# Patient Record
Sex: Female | Born: 1960 | Race: Black or African American | Hispanic: No | Marital: Married | Smoking: Never smoker
Health system: Southern US, Community
[De-identification: ages and names within clinical notes are randomized; demographics above are authoritative.]

## PROBLEM LIST (undated history)

## (undated) DIAGNOSIS — K219 Gastro-esophageal reflux disease without esophagitis: Secondary | ICD-10-CM

## (undated) DIAGNOSIS — E669 Obesity, unspecified: Secondary | ICD-10-CM

## (undated) DIAGNOSIS — J309 Allergic rhinitis, unspecified: Secondary | ICD-10-CM

## (undated) DIAGNOSIS — I10 Essential (primary) hypertension: Secondary | ICD-10-CM

## (undated) HISTORY — DX: Essential (primary) hypertension: I10

## (undated) HISTORY — DX: Obesity, unspecified: E66.9

## (undated) HISTORY — DX: Gastro-esophageal reflux disease without esophagitis: K21.9

## (undated) HISTORY — PX: COLONOSCOPY: SHX174

## (undated) HISTORY — PX: VAGINAL HYSTERECTOMY: SUR661

## (undated) HISTORY — DX: Allergic rhinitis, unspecified: J30.9

---

## 2004-07-14 DIAGNOSIS — I10 Essential (primary) hypertension: Secondary | ICD-10-CM

## 2004-07-14 HISTORY — DX: Essential (primary) hypertension: I10

## 2005-09-17 ENCOUNTER — Ambulatory Visit: Payer: Self-pay | Admitting: Internal Medicine

## 2005-09-19 ENCOUNTER — Ambulatory Visit: Payer: Self-pay | Admitting: *Deleted

## 2005-10-21 ENCOUNTER — Ambulatory Visit: Payer: Self-pay | Admitting: Internal Medicine

## 2005-11-07 ENCOUNTER — Ambulatory Visit: Payer: Self-pay | Admitting: Internal Medicine

## 2005-11-10 ENCOUNTER — Ambulatory Visit: Payer: Self-pay | Admitting: Internal Medicine

## 2005-12-10 ENCOUNTER — Ambulatory Visit: Payer: Self-pay | Admitting: Internal Medicine

## 2006-06-09 ENCOUNTER — Ambulatory Visit: Payer: Self-pay | Admitting: Internal Medicine

## 2007-01-05 ENCOUNTER — Ambulatory Visit: Payer: Self-pay | Admitting: Internal Medicine

## 2007-01-07 ENCOUNTER — Ambulatory Visit (HOSPITAL_COMMUNITY): Admission: RE | Admit: 2007-01-07 | Discharge: 2007-01-07 | Payer: Self-pay | Admitting: Internal Medicine

## 2007-01-08 ENCOUNTER — Ambulatory Visit: Payer: Self-pay | Admitting: Internal Medicine

## 2007-01-11 ENCOUNTER — Ambulatory Visit: Payer: Self-pay | Admitting: Internal Medicine

## 2007-01-13 ENCOUNTER — Encounter: Admission: RE | Admit: 2007-01-13 | Discharge: 2007-01-13 | Payer: Self-pay | Admitting: Internal Medicine

## 2007-03-31 ENCOUNTER — Encounter (INDEPENDENT_AMBULATORY_CARE_PROVIDER_SITE_OTHER): Payer: Self-pay | Admitting: *Deleted

## 2007-10-01 ENCOUNTER — Ambulatory Visit: Payer: Self-pay | Admitting: Internal Medicine

## 2007-10-01 DIAGNOSIS — G5600 Carpal tunnel syndrome, unspecified upper limb: Secondary | ICD-10-CM

## 2007-10-01 DIAGNOSIS — J309 Allergic rhinitis, unspecified: Secondary | ICD-10-CM | POA: Insufficient documentation

## 2007-10-01 DIAGNOSIS — I10 Essential (primary) hypertension: Secondary | ICD-10-CM | POA: Insufficient documentation

## 2009-03-03 ENCOUNTER — Emergency Department (HOSPITAL_COMMUNITY): Admission: EM | Admit: 2009-03-03 | Discharge: 2009-03-03 | Payer: Self-pay | Admitting: Family Medicine

## 2014-11-08 ENCOUNTER — Encounter: Payer: Self-pay | Admitting: Family Medicine

## 2014-11-16 ENCOUNTER — Ambulatory Visit (INDEPENDENT_AMBULATORY_CARE_PROVIDER_SITE_OTHER): Payer: BLUE CROSS/BLUE SHIELD | Admitting: Medical

## 2014-11-16 ENCOUNTER — Encounter: Payer: Self-pay | Admitting: Medical

## 2014-11-16 ENCOUNTER — Telehealth: Payer: Self-pay | Admitting: Medical

## 2014-11-16 VITALS — BP 138/80 | HR 76 | Temp 98.1°F | Resp 15 | Ht 66.0 in | Wt 202.0 lb

## 2014-11-16 DIAGNOSIS — Z1239 Encounter for other screening for malignant neoplasm of breast: Secondary | ICD-10-CM | POA: Diagnosis not present

## 2014-11-16 DIAGNOSIS — I10 Essential (primary) hypertension: Secondary | ICD-10-CM

## 2014-11-16 DIAGNOSIS — Z23 Encounter for immunization: Secondary | ICD-10-CM

## 2014-11-16 DIAGNOSIS — E669 Obesity, unspecified: Secondary | ICD-10-CM

## 2014-11-16 DIAGNOSIS — Z0000 Encounter for general adult medical examination without abnormal findings: Secondary | ICD-10-CM | POA: Diagnosis not present

## 2014-11-16 DIAGNOSIS — Z8041 Family history of malignant neoplasm of ovary: Secondary | ICD-10-CM

## 2014-11-16 DIAGNOSIS — E876 Hypokalemia: Secondary | ICD-10-CM

## 2014-11-16 DIAGNOSIS — Z1211 Encounter for screening for malignant neoplasm of colon: Secondary | ICD-10-CM

## 2014-11-16 LAB — LIPID PANEL
Cholesterol: 168 mg/dL (ref 0–200)
HDL: 52 mg/dL (ref >=46)
LDL Cholesterol: 106 mg/dL — ABNORMAL HIGH (ref 0–99)
Total CHOL/HDL Ratio: 3.2 ratio
Triglycerides: 48 mg/dL (ref <150)
VLDL: 10 mg/dL (ref 0–40)

## 2014-11-16 LAB — CBC
HCT: 40.5 % (ref 36.0–46.0)
Hemoglobin: 14 g/dL (ref 12.0–15.0)
MCH: 29.2 pg (ref 26.0–34.0)
MCHC: 34.6 g/dL (ref 30.0–36.0)
MCV: 84.6 fL (ref 78.0–100.0)
MPV: 11.2 fL (ref 8.6–12.4)
Platelets: 295 K/uL (ref 150–400)
RBC: 4.79 MIL/uL (ref 3.87–5.11)
RDW: 13.9 % (ref 11.5–15.5)
WBC: 8.4 K/uL (ref 4.0–10.5)

## 2014-11-16 LAB — POCT URINALYSIS DIPSTICK
Bilirubin, UA: NEGATIVE
Glucose, UA: NEGATIVE
Ketones, UA: NEGATIVE
Leukocytes, UA: NEGATIVE
Nitrite, UA: NEGATIVE
Protein, UA: NEGATIVE
Spec Grav, UA: >=1.03
Urobilinogen, UA: NEGATIVE
pH, UA: 6

## 2014-11-16 LAB — COMPREHENSIVE METABOLIC PANEL WITH GFR
ALT: 22 U/L (ref 0–35)
AST: 22 U/L (ref 0–37)
Albumin: 4.3 g/dL (ref 3.5–5.2)
Alkaline Phosphatase: 79 U/L (ref 39–117)
BUN: 13 mg/dL (ref 6–23)
CO2: 24 meq/L (ref 19–32)
Calcium: 9.2 mg/dL (ref 8.4–10.5)
Chloride: 102 meq/L (ref 96–112)
Creat: 0.88 mg/dL (ref 0.50–1.10)
Glucose, Bld: 74 mg/dL (ref 70–99)
Potassium: 3.9 meq/L (ref 3.5–5.3)
Sodium: 139 meq/L (ref 135–145)
Total Bilirubin: 0.7 mg/dL (ref 0.2–1.2)
Total Protein: 7.1 g/dL (ref 6.0–8.3)

## 2014-11-16 LAB — TSH: TSH: 1.091 u[IU]/mL (ref 0.350–4.500)

## 2014-11-16 LAB — HEMOGLOBIN A1C
Hgb A1c MFr Bld: 6.1 % — ABNORMAL HIGH (ref <5.7)
Mean Plasma Glucose: 128 mg/dL — ABNORMAL HIGH (ref <117)

## 2014-11-16 MED ORDER — FAMOTIDINE 20 MG PO TABS: 20.0000 mg | ORAL_TABLET | Freq: Two times a day (BID) | ORAL | Status: AC

## 2014-11-16 MED ORDER — TRIAMCINOLONE ACETONIDE 55 MCG/ACT NA AERO: 2.0000 | INHALATION_SPRAY | Freq: Every day | NASAL | Status: DC

## 2014-11-16 MED ORDER — AMLODIPINE BESYLATE 10 MG PO TABS: 10.0000 mg | ORAL_TABLET | Freq: Every day | ORAL | Status: AC

## 2014-11-16 MED ORDER — CETIRIZINE HCL 10 MG PO TABS: 10.0000 mg | ORAL_TABLET | Freq: Every day | ORAL | Status: AC

## 2014-11-16 NOTE — Progress Notes (Signed)
Subjective:   HPI  Priscilla Morris is a 54 y.o. female who presents for a complete physical.  New patient today.  Was seeing PCP in [place] prior.  Medical care team/other doctors includes:  No other providers  Went for eye exam 2 years ago   Preventative care: Last physical or labs: unknown Sees dentist yearly: Yes on [employer] Last tetanus vaccine, TD or Tdap: unsure   Gynecology history: Pregnancy history Gravida 2 para 2 Contraception currently vaginally hysterectomy LMP 2005 Relationship status: married Concern for STD No, last STD testing 2005  Concerns:Would like to get established and get labs done and get refills on current meds , has never had colonoscopy, needs mammogram  Constipation - has BM q3-4 days, bloating and gas at times, this is long term  Reviewed their medical, surgical, family, social, medication, and allergy history and updated chart as appropriate.  Past Medical History  Diagnosis Date  . GERD (gastroesophageal reflux disease)   . Allergic rhinitis   . Hypertension 2006  . Obesity     Past Surgical History  Procedure Laterality Date  . Vaginal hysterectomy      fibroids; still has ovaries  . Colonoscopy      never as of 5/ 2016    History   Social History  . Marital Status: Married    Spouse Name: N/A  . Number of Children: N/A  . Years of Education: N/A   Occupational History  . Not on file.   Social History Main Topics  . Smoking status: Never Smoker   . Smokeless tobacco: Not on file  . Alcohol Use: No  . Drug Use: No  . Sexual Activity: Not on file   Other Topics Concern  . Not on file   Social History Narrative   Married, has 2 daughters 27yo and 34yo, one lives with her, one lives in [place], has 2 grandsons.   Works at [employer] in [employer].  Exercise - goes to [employer] 4 times per week.   As of 11/2014    Family History  Problem Relation Age of Onset  . Heart disease Mother 61    died of MI  .  Stroke Mother   . Osteoporosis Mother   . Other Father     murdered in a robbery  . Hypertension Sister   . Hypertension Brother   . Diabetes Brother   . Hypertension Sister   . Hypertension Sister   . Cancer Sister 83    ovarian  . Hypertension Sister   . Seizures Sister   . Hypertension Brother   . Cancer Brother     leukemia  . Hypertension Brother   . Hypertension Brother   . Other Brother     accidental death , hit by car  . Hypertension Brother   . Other Brother     tow motor fell on him  . Hypertension Brother   . Cancer Brother     lung cancer with mets  . Hypertension Brother      Current outpatient prescriptions:  .  amLODipine (NORVASC) 10 MG tablet, Take 1 tablet (10 mg total) by mouth daily., Disp: 90 tablet, Rfl: 3 .  cetirizine (ZYRTEC) 10 MG tablet, Take 1 tablet (10 mg total) by mouth daily., Disp: 90 tablet, Rfl: 3 .  famotidine (PEPCID) 20 MG tablet, Take 1 tablet (20 mg total) by mouth 2 (two) times daily., Disp: 60 tablet, Rfl: 2 .  naproxen (NAPROSYN) 500 MG tablet,  Take 500 mg by mouth 2 (two) times daily with a meal., Disp: , Rfl:  .  potassium chloride (K-DUR,KLOR-CON) 10 MEQ tablet, Take 10 mEq by mouth 2 (two) times daily., Disp: , Rfl:  .  triamcinolone (NASACORT) 55 MCG/ACT AERO nasal inhaler, Place 2 sprays into the nose daily., Disp: 1 Inhaler, Rfl: 11  Allergies  Allergen Reactions  . Penicillins Swelling  . Benadryl [Diphenhydramine]     Facial swelling.  Can take zyrtec without c/o     Review of Systems Constitutional: -fever, -chills, -sweats, -unexpected weight change, -decreased appetite, -fatigue Allergy: -sneezing, -itching, -congestion Dermatology: -changing moles, --rash, -lumps ENT: -runny nose, -ear pain, -sore throat, -hoarseness, -sinus pain, -teeth pain, - ringing in ears, -hearing loss, -nosebleeds Cardiology: -chest pain, -palpitations, -swelling, -difficulty breathing when lying flat, -waking up short of  breath Respiratory: -cough, -shortness of breath, -difficulty breathing with exercise or exertion, -wheezing, -coughing up blood Gastroenterology: -abdominal pain, -nausea, -vomiting, -diarrhea, -constipation, -blood in stool, -changes in bowel movement, -difficulty swallowing or eating.vs Hematology: -bleeding, -bruising  Musculoskeletal: -joint aches, -muscle aches, -joint swelling, -back pain, -neck pain, -cramping, -changes in gait Ophthalmology: denies vision changes, eye redness, itching, discharge Urology: -burning with urination, -difficulty urinating, -blood in urine, -urinary frequency, -urgency, -incontinence Neurology: -headache, -weakness, -tingling, +numbness, -memory loss, -falls, -dizziness Psychology: -depressed mood, -agitation, -sleep problems     Objective:   Physical Exam  BP 138/80 mmHg  Pulse 76  Temp(Src) 98.1 F (36.7 C) (Oral)  Resp 15  Ht 5\' 6"  (1.676 m)  Wt 202 lb (91.627 kg)  BMI 32.62 kg/m2  General appearance: alert, no distress, WD/WN, AA female Skin:several scattered macules, no particular worrisome lesions HEENT: normocephalic, conjunctiva/corneas normal, sclerae anicteric, PERRLA, EOMi, nares patent, no discharge or erythema, pharynx normal Oral cavity: MMM, tongue normal, teeth in good repair Neck: supple, no lymphadenopathy, no thyromegaly, no masses, normal ROM, no bruits Chest: non tender, normal shape and expansion Heart: RRR, normal S1, S2, no murmurs Lungs: CTA bilaterally, no wheezes, rhonchi, or rales Abdomen: +bs, soft, non tender, non distended, no masses, no hepatomegaly, no splenomegaly, no bruits Back: non tender, normal ROM, no scoliosis Musculoskeletal: upper extremities non tender, no obvious deformity, normal ROM throughout, lower extremities non tender, no obvious deformity, normal ROM throughout Extremities: no edema, no cyanosis, no clubbing Pulses: 2+ symmetric, upper and lower extremities, normal cap refill Neurological:  alert, oriented x 3, CN2-12 intact, strength normal upper extremities and lower extremities, sensation normal throughout, DTRs 2+ throughout, no cerebellar signs, gait normal Psychiatric: normal affect, behavior normal, pleasant  Breast:  nontender, no masses or lumps, no skin changes, no nipple discharge or inversion, no axillary lymphadenopathy Gyn: Normal external genitalia without lesions, vagina with normal mucosa, s/p hysterectomy, no abnormal vaginal discharge.  Adnexa not enlarged, nontender, no masses.  Exam chaperoned by nurse. Rectal: deferred/declined   Assessment and Plan :    Encounter Diagnoses  Name Primary?  . Encounter for health maintenance examination in adult Yes  . Obesity   . Essential hypertension   . Special screening for malignant neoplasms, colon   . Screening for breast cancer   . Family history of ovarian cancer   . Hypokalemia   . Need for Tdap vaccination     Physical exam - discussed healthy lifestyle, diet, exercise, preventative care, vaccinations, and addressed their concerns.   See your dentist yearly for routine dental care including hygiene visits twice yearly. See your eye doctor yearly for routine vision care.  Declines EKG today Routine labs today.  Vaccinations: adviesd yearly flu shot Counseled on the Tdap (tetanus, diptheria, and acellular pertussis) vaccine.  Vaccine information sheet given. Tdap vaccine given after consent obtained.  Other concerns today: She will go for mammogram Referral for colonoscopy screening Refilled medications today constipation - disused fiber and water intake  Follow up pending labs

## 2014-11-16 NOTE — Telephone Encounter (Signed)
Refer for first screening colonoscopy, try [facility] or [facility]ess[facility] or one of the [facility] folks

## 2014-11-17 LAB — MICROALBUMIN / CREATININE URINE RATIO
Creatinine, Urine: 131 mg/dL
Microalb Creat Ratio: 4.6 mg/g (ref 0.0–30.0)
Microalb, Ur: 0.6 mg/dL (ref <2.0)

## 2014-11-21 ENCOUNTER — Other Ambulatory Visit: Payer: Self-pay | Admitting: Family Medicine

## 2014-11-21 ENCOUNTER — Other Ambulatory Visit: Payer: Self-pay

## 2014-11-21 DIAGNOSIS — Z1211 Encounter for screening for malignant neoplasm of colon: Secondary | ICD-10-CM

## 2014-11-21 DIAGNOSIS — Z1231 Encounter for screening mammogram for malignant neoplasm of breast: Secondary | ICD-10-CM

## 2014-11-21 NOTE — Telephone Encounter (Signed)
Orders are in EPIC for the patients colonoscopy, [facility] will contact the patient by phone for her appointment

## 2014-11-30 ENCOUNTER — Ambulatory Visit
Admission: RE | Admit: 2014-11-30 | Discharge: 2014-11-30 | Disposition: A | Discharge status: Home / Self Care | Payer: BLUE CROSS/BLUE SHIELD | Source: Ambulatory Visit

## 2014-11-30 DIAGNOSIS — Z1231 Encounter for screening mammogram for malignant neoplasm of breast: Secondary | ICD-10-CM

## 2014-12-27 ENCOUNTER — Encounter: Payer: Self-pay | Admitting: Internal Medicine

## 2015-02-05 ENCOUNTER — Ambulatory Visit (INDEPENDENT_AMBULATORY_CARE_PROVIDER_SITE_OTHER): Payer: BLUE CROSS/BLUE SHIELD | Admitting: Medical

## 2015-02-05 ENCOUNTER — Encounter: Payer: Self-pay | Admitting: Medical

## 2015-02-05 VITALS — BP 118/80 | HR 72 | Temp 98.3°F | Resp 14 | Wt 202.0 lb

## 2015-02-05 DIAGNOSIS — H9202 Otalgia, left ear: Secondary | ICD-10-CM | POA: Diagnosis not present

## 2015-02-05 DIAGNOSIS — J349 Unspecified disorder of nose and nasal sinuses: Secondary | ICD-10-CM

## 2015-02-05 DIAGNOSIS — H68012 Acute Eustachian salpingitis, left ear: Secondary | ICD-10-CM

## 2015-02-05 MED ORDER — FLUTICASONE PROPIONATE 50 MCG/ACT NA SUSP: 2.0000 | Freq: Every day | NASAL | Status: AC

## 2015-02-05 MED ORDER — AZITHROMYCIN 250 MG PO TABS: ORAL_TABLET | ORAL | Status: AC

## 2015-02-05 NOTE — Progress Notes (Signed)
Subjective: Here for ear pain.  Been having 1 week hx/o left ear pain/ear fullness, lymph node swollen on left, has ongoing "sinus trouble" but no sore throat, no fever, no teeth pain, no purulent discharge, no sneezing no runny nose.   Doesn't drink as much water as she thinks she should.   Using zyrtec.  No sick contacts.  Non smoker.  No other aggravating or relieving factors. No other complaint.  Past Medical History  Diagnosis Date  . GERD (gastroesophageal reflux disease)   . Allergic rhinitis   . Hypertension 2006  . Obesity    ROS as in subjective   Objective: BP 118/80 mmHg  Pulse 72  Temp(Src) 98.3 F (36.8 C) (Oral)  Resp 14  Wt 202 lb (91.627 kg)  BP Readings from Last 3 Encounters:  02/05/15 118/80  11/16/14 138/80   Gen: wd, wn, nad Skin: unremarkable Hent: no sinus tenderness, TMs with some serous fluid, some flat appearing of TMs, nares patent, pharnyx normal Heart RRR normal s1, s2, no murmrus Lungs clear   Assessment: Encounter Diagnoses  Name Primary?  . Eustachian salpingitis, acute, left Yes  . Otalgia of left ear   . Sinus problem     Plan: discussed minimal exam findings, but given symptoms, begin Flonase, c/t zyrtec, and if not seeing improvement within a week, begin zpak.  Hydrate well, can use OTC analgesic.   Cal/return if worse or not improving within a week.

## 2015-11-02 ENCOUNTER — Other Ambulatory Visit: Payer: Self-pay

## 2015-11-02 DIAGNOSIS — Z1231 Encounter for screening mammogram for malignant neoplasm of breast: Secondary | ICD-10-CM

## 2015-12-06 ENCOUNTER — Ambulatory Visit
Admission: RE | Admit: 2015-12-06 | Discharge: 2015-12-06 | Disposition: A | Discharge status: Home / Self Care | Payer: BLUE CROSS/BLUE SHIELD | Source: Ambulatory Visit

## 2015-12-06 DIAGNOSIS — Z1231 Encounter for screening mammogram for malignant neoplasm of breast: Secondary | ICD-10-CM

## 2016-01-24 ENCOUNTER — Ambulatory Visit
Admission: RE | Admit: 2016-01-24 | Discharge: 2016-01-24 | Disposition: A | Discharge status: Home / Self Care | Payer: BLUE CROSS/BLUE SHIELD | Source: Ambulatory Visit | Attending: Family | Admitting: Family

## 2016-01-24 ENCOUNTER — Other Ambulatory Visit: Payer: Self-pay | Admitting: Family

## 2016-01-24 DIAGNOSIS — M25561 Pain in right knee: Secondary | ICD-10-CM

## 2019-02-04 ENCOUNTER — Other Ambulatory Visit: Payer: Self-pay | Admitting: Internal Medicine

## 2019-02-04 DIAGNOSIS — Z1231 Encounter for screening mammogram for malignant neoplasm of breast: Secondary | ICD-10-CM

## 2019-03-24 ENCOUNTER — Other Ambulatory Visit: Payer: Self-pay

## 2019-03-24 ENCOUNTER — Ambulatory Visit
Admission: RE | Admit: 2019-03-24 | Discharge: 2019-03-24 | Disposition: A | Discharge status: Home / Self Care | Payer: BLUE CROSS/BLUE SHIELD | Source: Ambulatory Visit | Attending: Internal Medicine | Admitting: Internal Medicine

## 2019-03-24 DIAGNOSIS — Z1231 Encounter for screening mammogram for malignant neoplasm of breast: Secondary | ICD-10-CM

## 2019-07-20 ENCOUNTER — Ambulatory Visit (INDEPENDENT_AMBULATORY_CARE_PROVIDER_SITE_OTHER): Payer: BC Managed Care – PPO

## 2019-07-20 ENCOUNTER — Other Ambulatory Visit: Payer: Self-pay

## 2019-07-20 ENCOUNTER — Ambulatory Visit (INDEPENDENT_AMBULATORY_CARE_PROVIDER_SITE_OTHER): Payer: BC Managed Care – PPO | Admitting: Podiatry

## 2019-07-20 DIAGNOSIS — M65972 Unspecified synovitis and tenosynovitis, left ankle and foot: Secondary | ICD-10-CM

## 2019-07-20 DIAGNOSIS — M659 Synovitis and tenosynovitis, unspecified: Secondary | ICD-10-CM

## 2019-07-20 DIAGNOSIS — M722 Plantar fascial fibromatosis: Secondary | ICD-10-CM

## 2019-07-20 MED ORDER — MELOXICAM 15 MG PO TABS: 15.0000 mg | ORAL_TABLET | Freq: Every day | ORAL | 1 refills | Status: AC

## 2019-07-20 MED ORDER — METHYLPREDNISOLONE 4 MG PO TBPK: ORAL_TABLET | ORAL | 0 refills | Status: AC

## 2019-07-24 NOTE — Progress Notes (Signed)
   Subjective: 59 y.o. female presenting today as a new patient with a chief complaint of burning, throbbing pain to the plantar feet bilaterally and the left ankle joint that began about one year ago. She reports associated swelling of the ankle. Walking and standing increases the pain. She states she works at [employer] standing for 8 hours daily which exacerbates her symptoms. She has tried using an ankle brace, copper socks and support stockings for treatment. Patient is here for further evaluation and treatment.   Past Medical History:  Diagnosis Date  . Allergic rhinitis   . GERD (gastroesophageal reflux disease)   . Hypertension 2006  . Obesity      Objective: Physical Exam General: The patient is alert and oriented x3 in no acute distress.  Dermatology: Skin is warm, dry and supple bilateral lower extremities. Negative for open lesions or macerations bilateral.   Vascular: Dorsalis Pedis and Posterior Tibial pulses palpable bilateral.  Capillary fill time is immediate to all digits.  Neurological: Epicritic and protective threshold intact bilateral.   Musculoskeletal: Tenderness to palpation to the plantar aspect of the bilateral heels along the plantar fascia as well as to the medial, lateral and anterior aspects of the left ankle joint. All other joints range of motion within normal limits bilateral. Strength 5/5 in all groups bilateral.   Radiographic exam: Normal osseous mineralization. Joint spaces preserved. No fracture/dislocation/boney destruction. No other soft tissue abnormalities or radiopaque foreign bodies.   Assessment: 1. plantar fasciitis bilateral feet 2. Ankle synovitis left   Plan of Care:  1. Patient evaluated. Xrays reviewed.   2. Injection of 0.5cc Celestone soluspan injected into the bilateral heels.  3. Injection of 0.5 mLs Celestone Soluspan injected into the left ankle joint.  4. Prescription for Medrol Dose Pak provided to patient. 5. Prescription  for Meloxicam provided to patient. 6. Ankle brace dispensed for the left ankle.  7. Plantar fascial brace dispensed for right foot.  8. OTC Powerstep insoles provided to patient.  9. Return to clinic in 4 weeks.      Felecia Shelling, DPM [facility]  Dr. Felecia Shelling, DPM    [address] [place], [place] [address]                Office [phone]  Fax [phone]

## 2019-08-17 ENCOUNTER — Ambulatory Visit: Payer: BC Managed Care – PPO | Admitting: Podiatry

## 2019-09-05 ENCOUNTER — Ambulatory Visit (INDEPENDENT_AMBULATORY_CARE_PROVIDER_SITE_OTHER): Payer: BC Managed Care – PPO | Admitting: Podiatry

## 2019-09-05 ENCOUNTER — Other Ambulatory Visit: Payer: Self-pay

## 2019-09-05 DIAGNOSIS — M7662 Achilles tendinitis, left leg: Secondary | ICD-10-CM

## 2019-09-05 DIAGNOSIS — M722 Plantar fascial fibromatosis: Secondary | ICD-10-CM | POA: Diagnosis not present

## 2019-09-09 NOTE — Progress Notes (Signed)
   HPI: 59 y.o. female presenting today for follow up evaluation of plantar fasciitis bilaterally and left ankle pain. She states her pain has worsened since her last visit. She has been using the ankle brace and plantar fascial brace as directed with minimal relief. She reports taking the Medrol Dose Pak and has been taking Meloxicam since with no significant relief. She states the injection helped alleviate the pain for about 1-2 weeks. Being on the feet increases her pain. Patient is here for further evaluation and treatment.  Past Medical History:  Diagnosis Date  . Allergic rhinitis   . GERD (gastroesophageal reflux disease)   . Hypertension 2006  . Obesity       Physical Exam: General: The patient is alert and oriented x3 in no acute distress.  Dermatology: Skin is warm, dry and supple bilateral lower extremities. Negative for open lesions or macerations.  Vascular: Palpable pedal pulses bilaterally. No edema or erythema noted. Capillary refill within normal limits.  Neurological: Epicritic and protective threshold grossly intact bilaterally.   Musculoskeletal Exam: Pain on palpation noted to the posterior tubercle of the left calcaneus at the insertion of the Achilles tendon consistent with retrocalcaneal bursitis. Range of motion within normal limits. Muscle strength 5/5 in all muscle groups bilateral lower extremities.  Assessment: 1. Achilles tendinitis left  2. Plantar fasciitis bilateral, right greater than left  3. Ankle synovitis left - resolved    Plan of Care:  1. Patient was evaluated.  2. Injection of 0.5 mL Celestone Soluspan injected into the retrocalcaneal bursa. Care was taken to avoid direct injection into the Achilles tendon. 3. Injection of 0.5 mLs Celestone Soluspan injected into the right heel.  4. Discontinue taking Meloxicam. Resume taking Naprosyn 500 mg BID as directed by PCP.  5. Continue using ankle brace left.  6. Return to clinic in 3 months.    Works at [employer].    Felecia Shelling, DPM [facility]  Dr. Felecia Shelling, DPM    [address]                                        [place], [place] [address]                Office [phone]  Fax [phone]

## 2019-12-07 ENCOUNTER — Ambulatory Visit: Payer: BC Managed Care – PPO | Admitting: Podiatry

## 2019-12-28 ENCOUNTER — Ambulatory Visit: Payer: BC Managed Care – PPO | Admitting: Orthopaedic Surgery

## 2020-07-27 ENCOUNTER — Ambulatory Visit
Admission: RE | Admit: 2020-07-27 | Discharge: 2020-07-27 | Disposition: A | Discharge status: Home / Self Care | Payer: BC Managed Care – PPO | Source: Ambulatory Visit | Attending: Internal Medicine | Admitting: Internal Medicine

## 2020-07-27 ENCOUNTER — Other Ambulatory Visit: Payer: Self-pay | Admitting: Internal Medicine

## 2020-07-27 DIAGNOSIS — M5440 Lumbago with sciatica, unspecified side: Secondary | ICD-10-CM

## 2020-07-27 DIAGNOSIS — M47814 Spondylosis without myelopathy or radiculopathy, thoracic region: Secondary | ICD-10-CM

## 2021-02-04 IMAGING — CR DG THORACIC SPINE 3V
3 series · 3 of 3 positions shown · non-contrast
Comparison: None.

CLINICAL DATA: Spondylosis.  Mid back pain

EXAM:
THORACIC SPINE - 3 VIEWS

[w t-spine a.p. *]
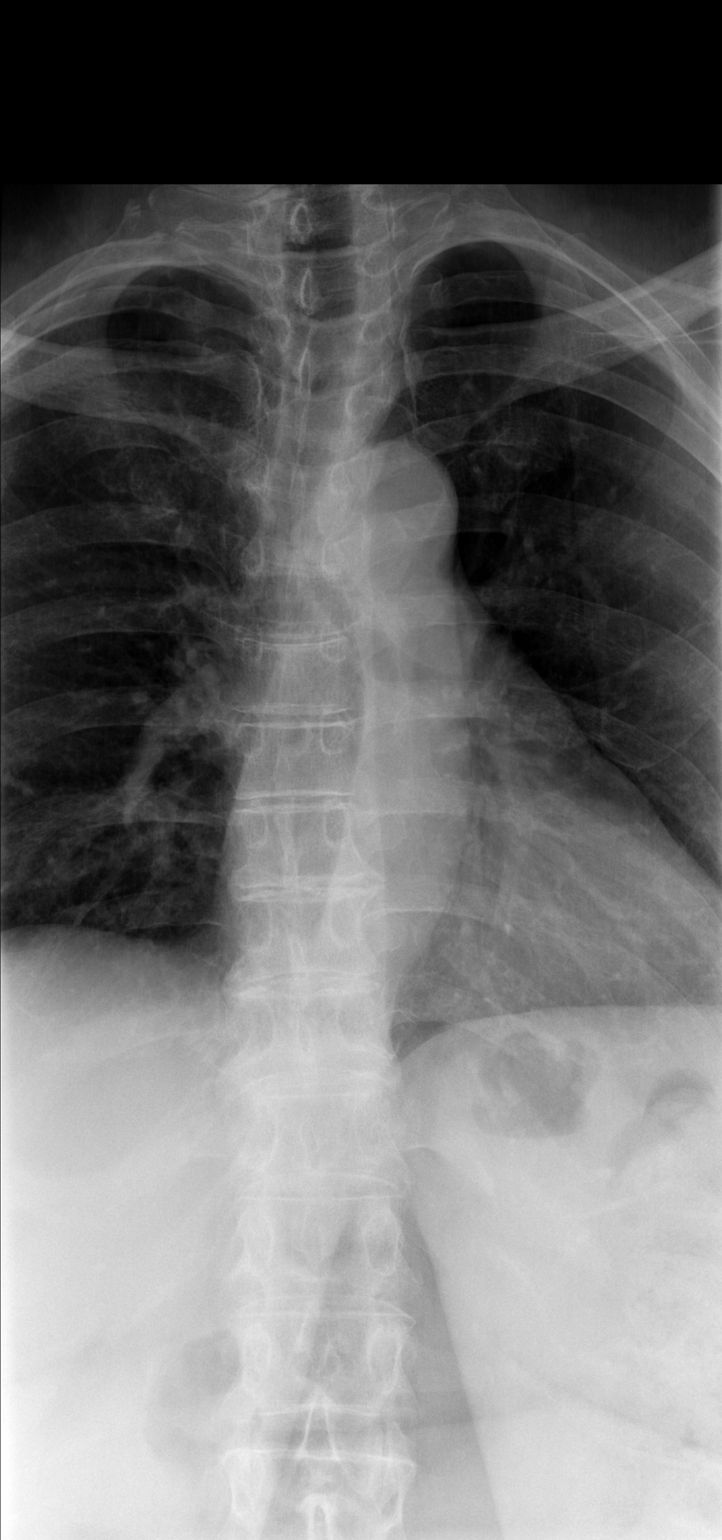

[w t-spine lat *]
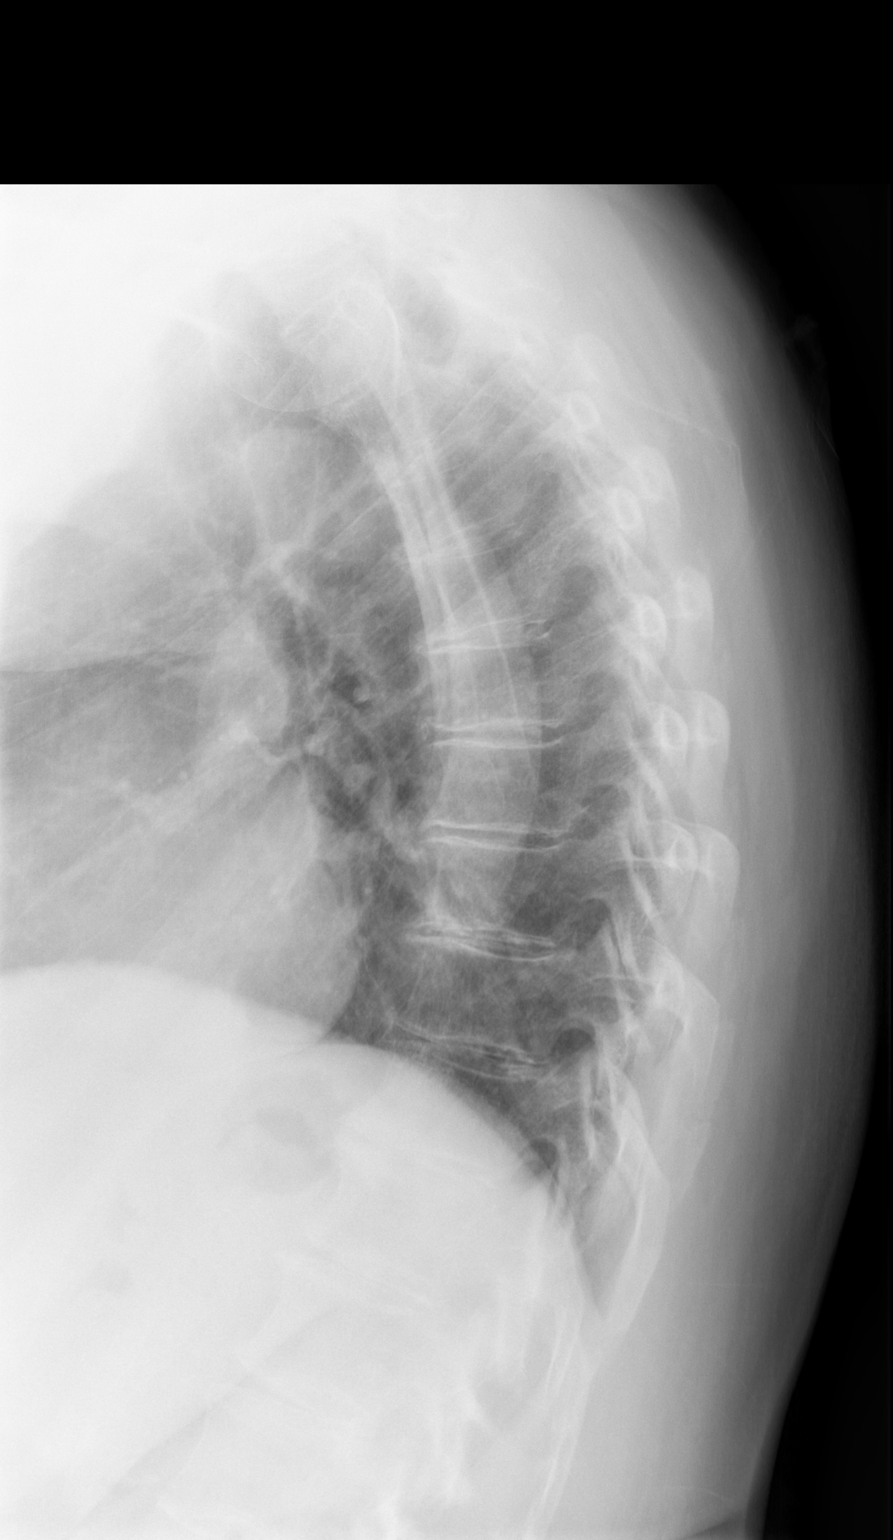

[w swimmers view *]
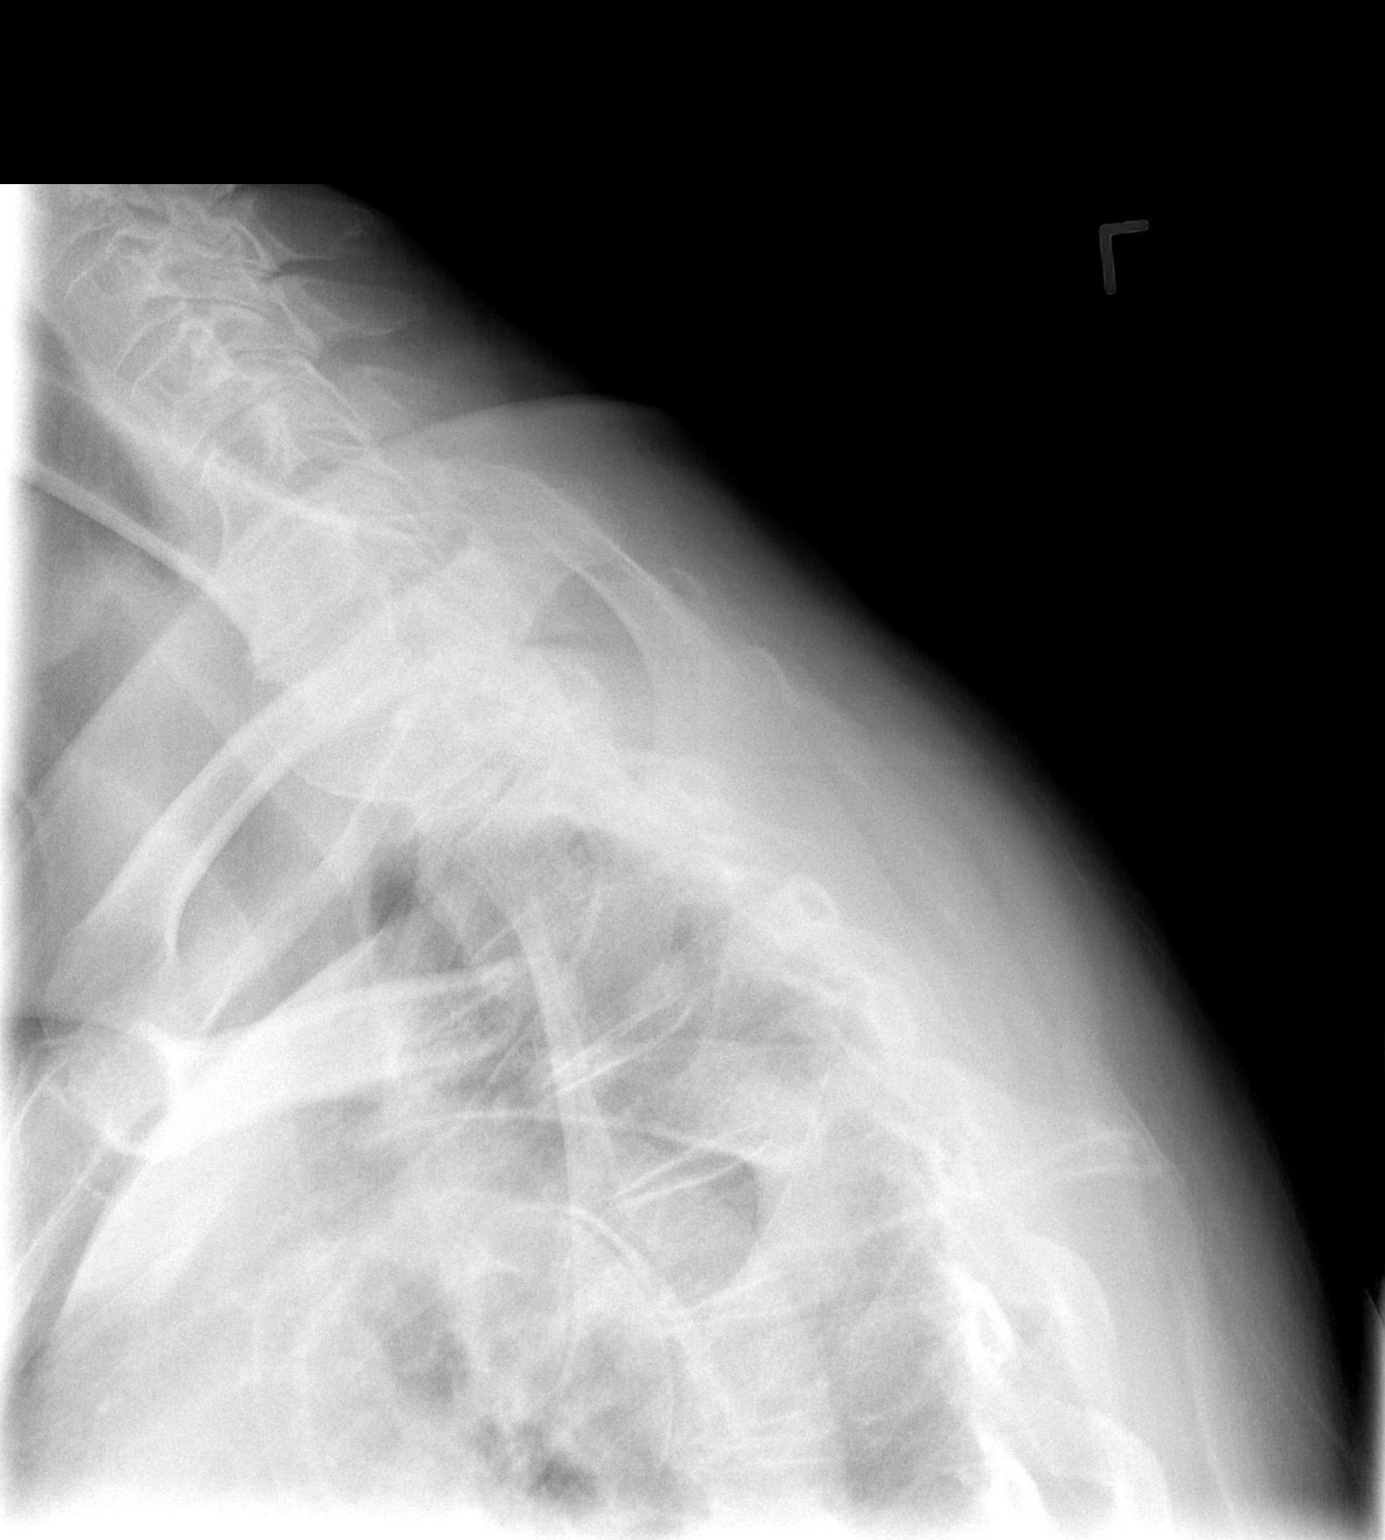

[3 of 3 positions shown; findings below may reference images not displayed]

FINDINGS: Normal alignment of the thoracic vertebral bodies. No loss of
vertebral body height or disc height. No subluxation. Normal
paraspinal lines. Mild endplate spurring and sclerosis in lower
thoracic spine.
IMPRESSION: Mild degenerative disc disease.  No acute findings.

## 2024-01-19 ENCOUNTER — Telehealth: Payer: Self-pay | Admitting: Podiatry

## 2024-01-19 NOTE — Telephone Encounter (Signed)
 Left voicemail on [phone] number for patient to schedule referral appointment for referral from Dr. Velna Skeeter with [facility].

## 2024-02-05 ENCOUNTER — Ambulatory Visit: Admitting: Podiatry

## 2024-02-05 VITALS — Ht 66.0 in | Wt 197.0 lb

## 2024-02-05 DIAGNOSIS — L84 Corns and callosities: Secondary | ICD-10-CM

## 2024-02-05 DIAGNOSIS — Z79899 Other long term (current) drug therapy: Secondary | ICD-10-CM | POA: Diagnosis not present

## 2024-02-05 DIAGNOSIS — M79675 Pain in left toe(s): Secondary | ICD-10-CM | POA: Diagnosis not present

## 2024-02-05 DIAGNOSIS — M79674 Pain in right toe(s): Secondary | ICD-10-CM | POA: Diagnosis not present

## 2024-02-05 DIAGNOSIS — B351 Tinea unguium: Secondary | ICD-10-CM

## 2024-02-05 MED ORDER — TERBINAFINE HCL 250 MG PO TABS: 250.0000 mg | ORAL_TABLET | Freq: Every day | ORAL | 0 refills | Status: DC

## 2024-02-05 MED ORDER — TERBINAFINE HCL 250 MG PO TABS: 250.0000 mg | ORAL_TABLET | Freq: Every day | ORAL | 0 refills | Status: AC

## 2024-02-05 NOTE — Progress Notes (Signed)
 Subjective:   Patient ID: Priscilla Morris, female   DOB: 63 y.o.   MRN: [id number]   HPI Chief Complaint  Patient presents with   Callouses    RM 1 Patient is here for left foot callus (lateral side). Patient  states callus has been present for the last year. Patient states soaking left foot in epsom salt weekly.   63 year old female presents the office with above concerns.  She states that she was wearing a different insole which struck the side of her foot causing a callus and pain.  She is on her feet quite a bit as she works at [employer] which aggravates her symptoms.  No open lesions or any drainage.  She also states her nails are thick and they cause discomfort.  She is not able to trim them herself.   Review of Systems  All other systems reviewed and are negative.  Past Medical History:  Diagnosis Date   Allergic rhinitis    GERD (gastroesophageal reflux disease)    Hypertension 2006   Obesity     Past Surgical History:  Procedure Laterality Date   COLONOSCOPY     never as of 5/ 2016   VAGINAL HYSTERECTOMY     fibroids; still has ovaries     Current Outpatient Medications:    amLODipine (NORVASC) 10 MG tablet, Take 1 tablet (10 mg total) by mouth daily., Disp: 90 tablet, Rfl: 3   azithromycin (ZITHROMAX) 250 MG tablet, 2 tablets day 1, then 1 tablet days 2-4, Disp: 6 tablet, Rfl: 0   cetirizine (ZYRTEC) 10 MG tablet, Take 1 tablet (10 mg total) by mouth daily., Disp: 90 tablet, Rfl: 3   famotidine (PEPCID) 20 MG tablet, Take 1 tablet (20 mg total) by mouth 2 (two) times daily., Disp: 60 tablet, Rfl: 2   fluticasone (FLONASE) 50 MCG/ACT nasal spray, Place 2 sprays into both nostrils daily., Disp: 16 g, Rfl: 2   meloxicam (MOBIC) 15 MG tablet, Take 1 tablet (15 mg total) by mouth daily., Disp: 30 tablet, Rfl: 1   methylPREDNISolone (MEDROL DOSEPAK) 4 MG TBPK tablet, 6 day dose pack - take as directed, Disp: 21 tablet, Rfl: 0   naproxen (NAPROSYN) 500 MG tablet, Take 500 mg  by mouth 2 (two) times daily with a meal., Disp: , Rfl:    potassium chloride (K-DUR,KLOR-CON) 10 MEQ tablet, Take 10 mEq by mouth 2 (two) times daily., Disp: , Rfl:    terbinafine (LAMISIL) 250 MG tablet, Take 1 tablet (250 mg total) by mouth daily., Disp: 90 tablet, Rfl: 0  Allergies  Allergen Reactions   Penicillins Swelling   Benadryl [Diphenhydramine]     Facial swelling.  Can take zyrtec without c/o   Diphenhydramine Hcl    Penicillins           Objective:  Physical Exam  General: AAO x3, NAD  Dermatological: Nails are hypertrophic, dystrophic, brittle, yellow-brown discoloration, elongated 10. No surrounding redness or drainage. Tenderness nails 1-5 bilaterally.  Hyperkeratotic lesion noted to lateral aspect left fifth metatarsal base without any underlying ulceration, drainage or signs of infection.   Vascular: Dorsalis Pedis artery and Posterior Tibial artery pedal pulses are 2/4 bilateral with immedate capillary fill time.  There is no pain with calf compression, swelling, warmth, erythema.   Neruologic: Grossly intact via light touch bilateral.   Musculoskeletal: No other areas of tenderness.      Assessment:   Symptomatic onychomycosis, hyperkeratotic lesion     Plan:  -Treatment options discussed  including all alternatives, risks, and complications -Etiology of symptoms were discussed  Hyperkeratotic lesion - Sharply debrided x 1 without any complications or bleeding.  Discussed moisturizer, offloading.  Discussed shoes to avoid excess pressure  Symptomatic onychomycosis -Sharply debrided nails x 10 without any complications or bleeding. -Discussed treatment options for nail fungus.  Discussed oral, topical.  She wants to proceed with oral Lamisil.  Reviewed prior blood work which was showing normal liver function.  Will check CBC, LFT in 6 weeks.  **Called patient after the appt to let her know the blood work was from last year. She is to get the blood  work done prior to starting the medication. Message left by triage.     Donnice JONELLE Fees DPM

## 2024-02-05 NOTE — Patient Instructions (Signed)
 GET REPEAT BLOOD WORK DONE IN 6 WEEKS  -  Terbinafine Tablets What is this medication? TERBINAFINE (TER bin a feen) treats fungal infections of the nails. It belongs to a group of medications called antifungals. It will not treat infections caused by bacteria or viruses. This medicine may be used for other purposes; ask your health care provider or pharmacist if you have questions. COMMON BRAND NAME(S): Lamisil, Terbinex What should I tell my care team before I take this medication? They need to know if you have any of these conditions: Liver disease An unusual or allergic reaction to terbinafine, other medications, foods, dyes, or preservatives Pregnant or trying to get pregnant Breast-feeding How should I use this medication? Take this medication by mouth with water. Take it as directed on the prescription label at the same time every day. You can take it with or without food. If it upsets your stomach, take it with food. Keep taking it unless your care team tells you to stop. A special MedGuide will be given to you by the pharmacist with each prescription and refill. Be sure to read this information carefully each time. Talk to your care team about the use of this medication in children. Special care may be needed. Overdosage: If you think you have taken too much of this medicine contact a poison control center or emergency room at once. NOTE: This medicine is only for you. Do not share this medicine with others. What if I miss a dose? If you miss a dose, take it as soon as you can unless it is more than 4 hours late. If it is more than 4 hours late, skip the missed dose. Take the next dose at the normal time. What may interact with this medication? Do not take this medication with any of the following: Pimozide Thioridazine This medication may also interact with the following: Beta blockers Caffeine Certain medications for mental health  conditions Cimetidine Cyclosporine Medications for fungal infections, such as fluconazole or ketoconazole Medications for irregular heartbeat, such as amiodarone, flecainide, propafenone Rifampin Warfarin This list may not describe all possible interactions. Give your health care provider a list of all the medicines, herbs, non-prescription drugs, or dietary supplements you use. Also tell them if you smoke, drink alcohol, or use illegal drugs. Some items may interact with your medicine. What should I watch for while using this medication? Visit your care team for regular checks on your progress. Tell your care team if your symptoms do not start to get better or if they get worse. This medication may cause serious skin reactions. They can happen weeks to months after starting the medication. Contact your care team right away if you notice fevers or flu-like symptoms with a rash. The rash may be red or purple and then turn into blisters or peeling of the skin. You may also notice a red rash with swelling of the face, lips, or lymph nodes in your neck or under your arms. This medication can make you more sensitive to the sun. Keep out of the sun. If you cannot avoid being in the sun, wear protective clothing and sunscreen. Do not use sun lamps, tanning beds, or tanning booths. What side effects may I notice from receiving this medication? Side effects that you should report to your care team as soon as possible: Allergic reactions--skin rash, itching, hives, swelling of the face, lips, tongue, or throat Change in sense of smell Change in taste Infection--fever, chills, cough, or sore throat Liver  injury--right upper belly pain, loss of appetite, nausea, light-colored stool, dark yellow or brown urine, yellowing skin or eyes, unusual weakness or fatigue Low red blood cell level--unusual weakness or fatigue, dizziness, headache, trouble breathing Lupus-like syndrome--joint pain, swelling, or  stiffness, butterfly-shaped rash on the face, rashes that get worse in the sun, fever, unusual weakness or fatigue Rash, fever, and swollen lymph nodes Redness, blistering, peeling, or loosening of the skin, including inside the mouth Unusual bruising or bleeding Worsening mood, feelings of depression Side effects that usually do not require medical attention (report to your care team if they continue or are bothersome): Diarrhea Gas Headache Nausea Stomach pain Upset stomach This list may not describe all possible side effects. Call your doctor for medical advice about side effects. You may report side effects to FDA at 1-800-FDA-1088. Where should I keep my medication? Keep out of the reach of children and pets. Store between 20 and 25 degrees C (68 and 77 degrees F). Protect from light. Get rid of any unused medication after the expiration date. To get rid of medications that are no longer needed or have expired: Take the medication to a medication take-back program. Check with your pharmacy or law enforcement to find a location. If you cannot return the medication, check the label or package insert to see if the medication should be thrown out in the garbage or flushed down the toilet. If you are not sure, ask your care team. If it is safe to put it in the trash, take the medication out of the container. Mix the medication with cat litter, dirt, coffee grounds, or other unwanted substance. Seal the mixture in a bag or container. Put it in the trash. NOTE: This sheet is a summary. It may not cover all possible information. If you have questions about this medicine, talk to your doctor, pharmacist, or health care provider.  2024 Elsevier/Gold Standard (2023-01-16 00:00:00)

## 2024-02-12 ENCOUNTER — Telehealth: Payer: Self-pay

## 2024-02-12 NOTE — Telephone Encounter (Signed)
 PA Request received and submitted on 02/05/2024.

## 2024-05-13 ENCOUNTER — Ambulatory Visit: Admitting: Podiatry
